# Patient Record
Sex: Female | Born: 1955
Health system: Southern US, Community
[De-identification: ages and names within clinical notes are randomized; demographics above are authoritative.]

## PROBLEM LIST (undated history)

## (undated) DIAGNOSIS — C801 Malignant (primary) neoplasm, unspecified: Secondary | ICD-10-CM

## (undated) DIAGNOSIS — K219 Gastro-esophageal reflux disease without esophagitis: Secondary | ICD-10-CM

## (undated) DIAGNOSIS — M1990 Unspecified osteoarthritis, unspecified site: Secondary | ICD-10-CM

## (undated) DIAGNOSIS — E669 Obesity, unspecified: Secondary | ICD-10-CM

## (undated) DIAGNOSIS — K5730 Diverticulosis of large intestine without perforation or abscess without bleeding: Secondary | ICD-10-CM

## (undated) DIAGNOSIS — E785 Hyperlipidemia, unspecified: Secondary | ICD-10-CM

## (undated) DIAGNOSIS — E119 Type 2 diabetes mellitus without complications: Secondary | ICD-10-CM

## (undated) DIAGNOSIS — D573 Sickle-cell trait: Secondary | ICD-10-CM

## (undated) DIAGNOSIS — I10 Essential (primary) hypertension: Secondary | ICD-10-CM

## (undated) HISTORY — PX: COLON SURGERY: SHX602

## (undated) HISTORY — PX: TONSILLECTOMY: SUR1361

## (undated) HISTORY — PX: ABDOMINAL HYSTERECTOMY: SHX81

## (undated) HISTORY — PX: COLONOSCOPY: SHX174

## (undated) HISTORY — PX: TRANSVERSE COLON RESECTION: SHX6155

## (undated) HISTORY — PX: LAPAROSCOPIC COLON RESECTION: SUR791

---

## 1999-06-09 ENCOUNTER — Encounter: Admission: RE | Admit: 1999-06-09 | Discharge: 1999-07-25 | Payer: Self-pay | Admitting: Family Medicine

## 2004-03-25 ENCOUNTER — Ambulatory Visit: Payer: Self-pay | Admitting: Internal Medicine

## 2004-06-27 ENCOUNTER — Ambulatory Visit: Payer: Self-pay | Admitting: Family Medicine

## 2005-06-28 ENCOUNTER — Ambulatory Visit: Payer: Self-pay | Admitting: Family Medicine

## 2006-07-01 ENCOUNTER — Ambulatory Visit: Payer: Self-pay | Admitting: Gastroenterology

## 2006-07-24 ENCOUNTER — Ambulatory Visit: Payer: Self-pay | Admitting: Family Medicine

## 2007-07-29 ENCOUNTER — Ambulatory Visit: Payer: Self-pay | Admitting: Family Medicine

## 2008-08-02 ENCOUNTER — Ambulatory Visit: Payer: Self-pay | Admitting: Family Medicine

## 2009-07-28 ENCOUNTER — Ambulatory Visit: Payer: Self-pay | Admitting: Unknown Physician Specialty

## 2010-09-26 ENCOUNTER — Ambulatory Visit: Payer: Self-pay | Admitting: Unknown Physician Specialty

## 2011-09-27 ENCOUNTER — Ambulatory Visit: Payer: Self-pay | Admitting: Physician Assistant

## 2011-10-12 ENCOUNTER — Ambulatory Visit: Payer: Self-pay | Admitting: Gastroenterology

## 2012-10-15 ENCOUNTER — Ambulatory Visit: Payer: Self-pay | Admitting: Physician Assistant

## 2013-11-06 ENCOUNTER — Ambulatory Visit: Payer: Self-pay | Admitting: Physician Assistant

## 2014-10-12 ENCOUNTER — Other Ambulatory Visit: Payer: Self-pay | Admitting: Physician Assistant

## 2014-10-12 DIAGNOSIS — Z1231 Encounter for screening mammogram for malignant neoplasm of breast: Secondary | ICD-10-CM

## 2014-11-10 ENCOUNTER — Ambulatory Visit
Admission: RE | Admit: 2014-11-10 | Discharge: 2014-11-10 | Disposition: A | Discharge status: Home / Self Care | Payer: 59 | Source: Ambulatory Visit | Attending: Physician Assistant | Admitting: Physician Assistant

## 2014-11-10 DIAGNOSIS — Z1231 Encounter for screening mammogram for malignant neoplasm of breast: Secondary | ICD-10-CM | POA: Insufficient documentation

## 2014-11-10 HISTORY — DX: Malignant (primary) neoplasm, unspecified: C80.1

## 2015-04-13 ENCOUNTER — Other Ambulatory Visit: Payer: Self-pay | Admitting: Physician Assistant

## 2015-04-13 DIAGNOSIS — R1011 Right upper quadrant pain: Secondary | ICD-10-CM

## 2015-04-22 ENCOUNTER — Ambulatory Visit
Admission: RE | Admit: 2015-04-22 | Discharge: 2015-04-22 | Disposition: A | Discharge status: Home / Self Care | Payer: 59 | Source: Ambulatory Visit | Attending: Physician Assistant | Admitting: Physician Assistant

## 2015-04-22 DIAGNOSIS — R1011 Right upper quadrant pain: Secondary | ICD-10-CM | POA: Insufficient documentation

## 2015-10-18 ENCOUNTER — Other Ambulatory Visit: Payer: Self-pay | Admitting: Physician Assistant

## 2015-10-18 DIAGNOSIS — Z1231 Encounter for screening mammogram for malignant neoplasm of breast: Secondary | ICD-10-CM

## 2015-11-21 ENCOUNTER — Ambulatory Visit
Admission: RE | Admit: 2015-11-21 | Discharge: 2015-11-21 | Disposition: A | Discharge status: Home / Self Care | Payer: 59 | Source: Ambulatory Visit | Attending: Physician Assistant | Admitting: Physician Assistant

## 2015-11-21 DIAGNOSIS — Z1231 Encounter for screening mammogram for malignant neoplasm of breast: Secondary | ICD-10-CM | POA: Diagnosis present

## 2016-04-13 DIAGNOSIS — I159 Secondary hypertension, unspecified: Secondary | ICD-10-CM | POA: Diagnosis not present

## 2016-04-13 DIAGNOSIS — R7303 Prediabetes: Secondary | ICD-10-CM | POA: Diagnosis not present

## 2016-04-13 DIAGNOSIS — E785 Hyperlipidemia, unspecified: Secondary | ICD-10-CM | POA: Diagnosis not present

## 2016-04-17 DIAGNOSIS — E782 Mixed hyperlipidemia: Secondary | ICD-10-CM | POA: Diagnosis not present

## 2016-04-17 DIAGNOSIS — I10 Essential (primary) hypertension: Secondary | ICD-10-CM | POA: Diagnosis not present

## 2016-04-17 DIAGNOSIS — E118 Type 2 diabetes mellitus with unspecified complications: Secondary | ICD-10-CM | POA: Diagnosis not present

## 2016-05-30 ENCOUNTER — Encounter: Payer: 59 | Attending: Physician Assistant | Admitting: Dietician

## 2016-05-30 ENCOUNTER — Encounter: Payer: Self-pay | Admitting: Dietician

## 2016-05-30 VITALS — Ht 63.0 in | Wt 253.5 lb

## 2016-05-30 DIAGNOSIS — Z713 Dietary counseling and surveillance: Secondary | ICD-10-CM | POA: Insufficient documentation

## 2016-05-30 DIAGNOSIS — Z6841 Body Mass Index (BMI) 40.0 and over, adult: Secondary | ICD-10-CM | POA: Insufficient documentation

## 2016-05-30 DIAGNOSIS — R7303 Prediabetes: Secondary | ICD-10-CM

## 2016-05-30 DIAGNOSIS — E119 Type 2 diabetes mellitus without complications: Secondary | ICD-10-CM | POA: Insufficient documentation

## 2016-05-30 NOTE — Progress Notes (Signed)
Medical Nutrition Therapy: Visit start time: 1630  end time: 1730  Assessment:  Diagnosis: Diabetes/ pre-diabetes; obesity Past medical history: HTN, GERD Psychosocial issues/ stress concerns: none Preferred learning method:  . Auditory . Visual . Hands-on  Current weight: 253.5lbs with shoes  Height: 5'3" Medications, supplements: reconciled list in medical record  Progress and evaluation: Patient reports making some diet changes recently to improve blood sugar, smaller food portions and avoiding lat-night snacks. She has not yet noticed any weight loss. She has tried weight watchers in the past which did not work well for her. Exercise has declined in recent months. She reports lactose intolerance, can tolerate small amounts every few days. Also reports GI symptoms when consuming soy products. She states that she and her daughter will both be working on healthy lifestyle changes. HbA1C was 6.6% on recent labwork, patient states it was tested more recently at her work, and was lower; she states she has pre-diabetes.    Physical activity: walking 30 minutes, 1 day per week  Dietary Intake:  Usual eating pattern includes 2-3 meals and 1-2 snacks per day. Dining out frequency: 4 meals per week.  Breakfast: 9-10am jimmy dean breakfast cup, 2 pieces toast; occasionally cereal, not often due t Snack: few chips or 4 oreos thins Lunch: sometimes fruit if not hungry; Nabs with peanut butter Snack: same as am Supper: meat and at least 2 vegetables, sometimes fast food due to schedule Snack: occasionally if hungry few chips or pita chips Beverages: water, 1 12oz regular soda daily --Coke or grape  Nutrition Care Education: Topics covered: diabetes (prevention), weight management Basic nutrition: basic food groups, appropriate nutrient balance, appropriate meal and snack schedule, general nutrition guidelines    Weight control: benefits of weight control, importance of low fat and low sugar  foods, portion control and appropriate portions of carbohydrate foods, benefit of regular exercise on weight control as well as blood sugar control; benefits of tracking intake and/ or goal progress.  Advanced nutrition: dining out-- fast food information Diabetes:  goals for HbA1C, appropriate meal and snack schedule, appropriate carb intake and balance with lean protein sources, importance of adequate vegetable intake for multiple health reasons, role of exercise.   Nutritional Diagnosis:  [place]-2.2 Altered nutrition-related laboratory As related to hyperglycemia.  As evidenced by lab report, patient report. [place]-3.3 Overweight/obesity As related to excess calories, inactivity.  As evidenced by BMI 44.8, patient report.  Intervention: Instruction as noted above.   Set goals with direction from patient.   She is unsure of weight loss progress prior to next appt, will be at [facility] with her daughter for several days.   She has been making some positive diet changes, and is motivated to continue improvements.  Education Materials given:  . General diet guidelines for Diabetes . Plate Planner . Sample meal pattern/ menus . Carb Counting and Meal Planning (Novo) . Goals/ instructions  Learner/ who was taught:  . Patient   Level of understanding: Marland Kitchen Verbalizes/ demonstrates competency  Demonstrated degree of understanding via:   Teach back Learning barriers: . None  Willingness to learn/ readiness for change: . Eager, change in progress  Monitoring and Evaluation:  Dietary intake, exercise, BG control, and body weight      follow up: 07/09/16

## 2016-05-30 NOTE — Patient Instructions (Signed)
   Include more vegetables on a daily basis; try having a serving at lunchtime or for a snack, and eat generous vegetable portions at dinner.  Have 1-2 fruit cups daily.   Control portions of the starchy foods, keep to 1 cup (fist-size) or less with each meal -- or 2-3 servings.   Resume some regular exercise by [facility] visits.

## 2016-07-09 ENCOUNTER — Encounter: Payer: Self-pay | Admitting: Dietician

## 2016-07-09 ENCOUNTER — Encounter: Payer: 59 | Attending: Physician Assistant | Admitting: Dietician

## 2016-07-09 VITALS — Ht 63.5 in | Wt 252.4 lb

## 2016-07-09 DIAGNOSIS — Z6841 Body Mass Index (BMI) 40.0 and over, adult: Secondary | ICD-10-CM | POA: Diagnosis not present

## 2016-07-09 DIAGNOSIS — Z713 Dietary counseling and surveillance: Secondary | ICD-10-CM | POA: Insufficient documentation

## 2016-07-09 DIAGNOSIS — E66813 Obesity, class 3: Secondary | ICD-10-CM

## 2016-07-09 DIAGNOSIS — E119 Type 2 diabetes mellitus without complications: Secondary | ICD-10-CM | POA: Diagnosis not present

## 2016-07-09 DIAGNOSIS — R7303 Prediabetes: Secondary | ICD-10-CM

## 2016-07-09 NOTE — Progress Notes (Signed)
Medical Nutrition Therapy: Visit start time: 0935 end time: 1010  Assessment:  Diagnosis: pre-diabetes, obesity Medical history changes: no changes per patient Psychosocial issues/ stress concerns: none  Current weight: 252.4lbs  Height: 5'3.5" Medications, supplement changes: no changes per patient  Progress and evaluation: Weight loss of 1.2lbs since previous visit on 05/30/16, patient feels due to increased general activity. She reports difficulty working on any diet or lifestyle changes over the past month due to multiple out of town sporting events with her daughter. She reports more frequent restaurant meals and no time for exercise. They are now finished with out of town events, so she is planning to begin diet changes. She reports stressful work situation, and feels she needs to deal with work stress by physical activity breaks and avoiding snacks.   Physical activity: none  Dietary Intake:  Usual eating pattern includes 2-3 meals and 0-1 snacks per day. Dining out frequency: 5+ meals per week.  Breakfast: Danton Clap breakfast bowl; eggs, toast, sausage/ bacon Snack: few chips Lunch: Nabs or fruit Snack: few chips with sandwich if hungry Supper: occasionally meat, starch vegetables; often restaurant meals due to daughter's travelling sports team Snack: none Beverages: water, 1 regular soda daily, OJ in morning, country time or minute maid lemonade (does not like aftertaste of sugar free versions)  Nutrition Care Education: Topics covered: weight management, diabetes prevention Basic nutrition: reviewed appropriate nutrient balance, appropriate meal and snack schedule    Weight control: options for easy and quick balanced meals, importance of low sugar foods including beverages.  Diabetes prevention: appropriate carb intake and balance Other lifestyle changes: options for increasing general activity as means of stress management.   Nutritional Diagnosis:  [place]-2.2 Altered  nutrition-related laboratory As related to hyperglycemia.  As evidenced by lab report, patient report. [place]-3.3 Overweight/obesity As related to excess calories, inactivity.  As evidenced by BMI 44, patient report.  Intervention: Discussion as noted above.   Patient has definite plans for making positive lifestyle changes.   She declined further RD follow-up at this time, but will schedule later if needed.  Education Materials given:  Marland Kitchen Sample meal pattern/ menus . Goals/ instructions  Learner/ who was taught:  . Patient   Level of understanding: Marland Kitchen Verbalizes/ demonstrates competency  Demonstrated degree of understanding via:   Teach back Learning barriers: . None  Willingness to learn/ readiness for change: . Acceptance, ready for change  Monitoring and Evaluation:  Dietary intake, exercise, BG control, and body weight      follow up: prn

## 2016-07-09 NOTE — Patient Instructions (Signed)
   Resume effort to increase vegetables and fruits daily. Keep mental track of your daily intake and give yourself "credit" such as a check on the calendar for days you eat 2-3 or more servings.   Deal with work stress by taking a time-out and moving away from your desk for a few minutes to divert thoughts and energy.   [facility]   Keep working to control portions of starchy foods such as potatoes, rice, pasta/ noodles-- good job limiting breads.

## 2016-10-11 DIAGNOSIS — E118 Type 2 diabetes mellitus with unspecified complications: Secondary | ICD-10-CM | POA: Diagnosis not present

## 2016-10-11 DIAGNOSIS — E782 Mixed hyperlipidemia: Secondary | ICD-10-CM | POA: Diagnosis not present

## 2016-10-11 DIAGNOSIS — I10 Essential (primary) hypertension: Secondary | ICD-10-CM | POA: Diagnosis not present

## 2016-10-18 ENCOUNTER — Other Ambulatory Visit: Payer: Self-pay | Admitting: Physician Assistant

## 2016-10-18 DIAGNOSIS — I10 Essential (primary) hypertension: Secondary | ICD-10-CM | POA: Diagnosis not present

## 2016-10-18 DIAGNOSIS — Z1231 Encounter for screening mammogram for malignant neoplasm of breast: Secondary | ICD-10-CM

## 2016-10-18 DIAGNOSIS — Z0000 Encounter for general adult medical examination without abnormal findings: Secondary | ICD-10-CM | POA: Diagnosis not present

## 2016-10-18 DIAGNOSIS — E782 Mixed hyperlipidemia: Secondary | ICD-10-CM | POA: Diagnosis not present

## 2016-11-21 ENCOUNTER — Ambulatory Visit
Admission: RE | Admit: 2016-11-21 | Discharge: 2016-11-21 | Disposition: A | Discharge status: Home / Self Care | Payer: 59 | Source: Ambulatory Visit | Attending: Physician Assistant | Admitting: Physician Assistant

## 2016-11-21 DIAGNOSIS — Z1231 Encounter for screening mammogram for malignant neoplasm of breast: Secondary | ICD-10-CM | POA: Insufficient documentation

## 2017-01-24 DIAGNOSIS — C189 Malignant neoplasm of colon, unspecified: Secondary | ICD-10-CM | POA: Diagnosis not present

## 2017-01-24 DIAGNOSIS — R1319 Other dysphagia: Secondary | ICD-10-CM | POA: Diagnosis not present

## 2017-01-24 DIAGNOSIS — K219 Gastro-esophageal reflux disease without esophagitis: Secondary | ICD-10-CM | POA: Diagnosis not present

## 2017-03-06 ENCOUNTER — Encounter
Admission: RE | Disposition: A | Discharge status: Home / Self Care | Payer: Self-pay | Source: Ambulatory Visit | Attending: Internal Medicine

## 2017-03-06 ENCOUNTER — Ambulatory Visit
Admission: RE | Admit: 2017-03-06 | Discharge: 2017-03-06 | Disposition: A | Discharge status: Home / Self Care | Payer: 59 | Source: Ambulatory Visit | Attending: Internal Medicine | Admitting: Internal Medicine

## 2017-03-06 ENCOUNTER — Ambulatory Visit: Payer: 59 | Admitting: Anesthesiology

## 2017-03-06 DIAGNOSIS — K5730 Diverticulosis of large intestine without perforation or abscess without bleeding: Secondary | ICD-10-CM | POA: Diagnosis not present

## 2017-03-06 DIAGNOSIS — K2980 Duodenitis without bleeding: Secondary | ICD-10-CM | POA: Diagnosis not present

## 2017-03-06 DIAGNOSIS — E669 Obesity, unspecified: Secondary | ICD-10-CM | POA: Diagnosis not present

## 2017-03-06 DIAGNOSIS — R1310 Dysphagia, unspecified: Secondary | ICD-10-CM | POA: Insufficient documentation

## 2017-03-06 DIAGNOSIS — K559 Vascular disorder of intestine, unspecified: Secondary | ICD-10-CM | POA: Diagnosis not present

## 2017-03-06 DIAGNOSIS — M1990 Unspecified osteoarthritis, unspecified site: Secondary | ICD-10-CM | POA: Insufficient documentation

## 2017-03-06 DIAGNOSIS — K219 Gastro-esophageal reflux disease without esophagitis: Secondary | ICD-10-CM | POA: Insufficient documentation

## 2017-03-06 DIAGNOSIS — Z6841 Body Mass Index (BMI) 40.0 and over, adult: Secondary | ICD-10-CM | POA: Diagnosis not present

## 2017-03-06 DIAGNOSIS — Z08 Encounter for follow-up examination after completed treatment for malignant neoplasm: Secondary | ICD-10-CM | POA: Diagnosis not present

## 2017-03-06 DIAGNOSIS — Z880 Allergy status to penicillin: Secondary | ICD-10-CM | POA: Diagnosis not present

## 2017-03-06 DIAGNOSIS — K640 First degree hemorrhoids: Secondary | ICD-10-CM | POA: Diagnosis not present

## 2017-03-06 DIAGNOSIS — I10 Essential (primary) hypertension: Secondary | ICD-10-CM | POA: Diagnosis not present

## 2017-03-06 DIAGNOSIS — E785 Hyperlipidemia, unspecified: Secondary | ICD-10-CM | POA: Diagnosis not present

## 2017-03-06 DIAGNOSIS — Z86010 Personal history of colonic polyps: Secondary | ICD-10-CM | POA: Diagnosis not present

## 2017-03-06 DIAGNOSIS — Z1211 Encounter for screening for malignant neoplasm of colon: Secondary | ICD-10-CM | POA: Diagnosis not present

## 2017-03-06 DIAGNOSIS — Z79899 Other long term (current) drug therapy: Secondary | ICD-10-CM | POA: Insufficient documentation

## 2017-03-06 DIAGNOSIS — Z85038 Personal history of other malignant neoplasm of large intestine: Secondary | ICD-10-CM | POA: Diagnosis not present

## 2017-03-06 DIAGNOSIS — D573 Sickle-cell trait: Secondary | ICD-10-CM | POA: Insufficient documentation

## 2017-03-06 DIAGNOSIS — K55031 Focal (segmental) acute (reversible) ischemia of large intestine: Secondary | ICD-10-CM | POA: Diagnosis not present

## 2017-03-06 DIAGNOSIS — K3189 Other diseases of stomach and duodenum: Secondary | ICD-10-CM | POA: Insufficient documentation

## 2017-03-06 DIAGNOSIS — K2970 Gastritis, unspecified, without bleeding: Secondary | ICD-10-CM | POA: Diagnosis not present

## 2017-03-06 DIAGNOSIS — K648 Other hemorrhoids: Secondary | ICD-10-CM | POA: Diagnosis not present

## 2017-03-06 DIAGNOSIS — K5790 Diverticulosis of intestine, part unspecified, without perforation or abscess without bleeding: Secondary | ICD-10-CM | POA: Diagnosis not present

## 2017-03-06 DIAGNOSIS — K2990 Gastroduodenitis, unspecified, without bleeding: Secondary | ICD-10-CM | POA: Diagnosis not present

## 2017-03-06 DIAGNOSIS — K2950 Unspecified chronic gastritis without bleeding: Secondary | ICD-10-CM | POA: Insufficient documentation

## 2017-03-06 HISTORY — PX: ESOPHAGOGASTRODUODENOSCOPY (EGD) WITH PROPOFOL: SHX5813

## 2017-03-06 HISTORY — DX: Diverticulosis of large intestine without perforation or abscess without bleeding: K57.30

## 2017-03-06 HISTORY — DX: Sickle-cell trait: D57.3

## 2017-03-06 HISTORY — DX: Gastro-esophageal reflux disease without esophagitis: K21.9

## 2017-03-06 HISTORY — DX: Hyperlipidemia, unspecified: E78.5

## 2017-03-06 HISTORY — DX: Essential (primary) hypertension: I10

## 2017-03-06 HISTORY — DX: Obesity, unspecified: E66.9

## 2017-03-06 HISTORY — PX: COLONOSCOPY WITH PROPOFOL: SHX5780

## 2017-03-06 HISTORY — DX: Unspecified osteoarthritis, unspecified site: M19.90

## 2017-03-06 SURGERY — ESOPHAGOGASTRODUODENOSCOPY (EGD) WITH PROPOFOL
Anesthesia: General

## 2017-03-06 MED ORDER — LIDOCAINE HCL (CARDIAC) 20 MG/ML IV SOLN
INTRAVENOUS | Status: DC | PRN
  Administered 2017-03-06: 80 mg via INTRAVENOUS

## 2017-03-06 MED ORDER — PROPOFOL 10 MG/ML IV BOLUS
INTRAVENOUS | Status: DC | PRN
  Administered 2017-03-06: 80 mg via INTRAVENOUS

## 2017-03-06 MED ORDER — PROPOFOL 10 MG/ML IV BOLUS
INTRAVENOUS | Status: AC
  Filled 2017-03-06: qty 40

## 2017-03-06 MED ORDER — FENTANYL CITRATE (PF) 100 MCG/2ML IJ SOLN
INTRAMUSCULAR | Status: DC | PRN
  Administered 2017-03-06: 50 ug via INTRAVENOUS

## 2017-03-06 MED ORDER — FENTANYL CITRATE (PF) 100 MCG/2ML IJ SOLN
INTRAMUSCULAR | Status: AC
  Filled 2017-03-06: qty 2

## 2017-03-06 MED ORDER — SODIUM CHLORIDE 0.9 % IV SOLN
INTRAVENOUS | Status: DC
  Administered 2017-03-06: 10:00:00 via INTRAVENOUS

## 2017-03-06 MED ORDER — PROPOFOL 500 MG/50ML IV EMUL
INTRAVENOUS | Status: DC | PRN
  Administered 2017-03-06: 150 ug/kg/min via INTRAVENOUS

## 2017-03-06 NOTE — Anesthesia Postprocedure Evaluation (Signed)
Anesthesia Post Note  Patient: MARLEN KOMAN  Procedure(s) Performed: ESOPHAGOGASTRODUODENOSCOPY (EGD) WITH PROPOFOL (N/A ) COLONOSCOPY WITH PROPOFOL (N/A )  Patient location during evaluation: Endoscopy Anesthesia Type: General Level of consciousness: awake and alert and oriented Pain management: pain level controlled Vital Signs Assessment: post-procedure vital signs reviewed and stable Respiratory status: spontaneous breathing, nonlabored ventilation and respiratory function stable Cardiovascular status: blood pressure returned to baseline and stable Postop Assessment: no signs of nausea or vomiting Anesthetic complications: no     Last Vitals:  Vitals:   03/06/17 0956 03/06/17 1113  BP: 115/78 102/74  Pulse: 66   Resp: 20   Temp: (!) 36.1 C   SpO2: 97%     Last Pain:  Vitals:   03/06/17 0956  TempSrc: Tympanic                 Andersen Mckiver

## 2017-03-06 NOTE — Op Note (Signed)
[facility] Gastroenterology Patient Name: Terri Cain Procedure Date: 03/06/2017 10:39 AM MRN: [id number] Account #: [id number] Date of Birth: 1955/06/19 Admit Type: Outpatient Age: 62 Room: [facility] ENDO ROOM 4 Gender: Female Note Status: Finalized Procedure:            Colonoscopy Indications:          High risk colon cancer surveillance: Personal history                        of colon cancer Providers:            Benay Pike. Alice Reichert MD, MD Referring MD:         [facility]Barton Dubois (Referring MD), Precious Bard, MD (Referring MD) Medicines:            Propofol per Anesthesia Complications:        No immediate complications. Procedure:            Pre-Anesthesia Assessment:                       - The risks and benefits of the procedure and the                        sedation options and risks were discussed with the                        patient. All questions were answered and informed                        consent was obtained.                       - Patient identification and proposed procedure were                        verified prior to the procedure by the nurse. The                        procedure was verified in the procedure room.                       - ASA Grade Assessment: II - A patient with mild                        systemic disease.                       - After reviewing the risks and benefits, the patient                        was deemed in satisfactory condition to undergo the                        procedure.                       After obtaining informed consent, the colonoscope was  passed under direct vision. Throughout the procedure,                        the patient's blood pressure, pulse, and oxygen                        saturations were monitored continuously. The                        Colonoscope was introduced through the anus and   advanced to the the cecum, identified by appendiceal                        orifice and ileocecal valve. The colonoscopy was                        performed without difficulty. The patient tolerated the                        procedure well. The quality of the bowel preparation                        was good. Findings:      The perianal and digital rectal examinations were normal. Pertinent       negatives include normal sphincter tone and no palpable rectal lesions.      A few small-mouthed diverticula were found in the sigmoid colon and       transverse colon. There was no evidence of diverticular bleeding.      Non-bleeding internal hemorrhoids were found during retroflexion. The       hemorrhoids were Grade I (internal hemorrhoids that do not prolapse).      A single (solitary) one mm ulcer was found in the transverse colon. No       bleeding was present. No stigmata of recent bleeding were seen. Biopsies       were taken with a cold forceps for histology.      The exam was otherwise without abnormality. Impression:           - Mild diverticulosis in the sigmoid colon and in the                        transverse colon. There was no evidence of diverticular                        bleeding.                       - Non-bleeding internal hemorrhoids.                       - A single (solitary) ulcer in the transverse colon.                        Biopsied.                       - The examination was otherwise normal. Recommendation:       - Patient has a contact number available for                        emergencies. The signs and symptoms  of potential                        delayed complications were discussed with the patient.                        Return to normal activities tomorrow. Written discharge                        instructions were provided to the patient.                       - Resume previous diet.                       - Continue present medications.                        - Await pathology results.                       - Repeat colonoscopy in 5 years for surveillance.                       - Return to GI office in 6 months. Procedure Code(s):    --- Professional ---                       [phone], Colonoscopy, flexible; with biopsy, single or                        multiple Diagnosis Code(s):    --- Professional ---                       [phone], Personal history of other malignant neoplasm                        of large intestine                       K64.0, First degree hemorrhoids                       K63.3, Ulcer of intestine                       K57.30, Diverticulosis of large intestine without                        perforation or abscess without bleeding CPT copyright 2016 American Medical Association. All rights reserved. The codes documented in this report are preliminary and upon coder review may  be revised to meet current compliance requirements. Efrain Sella MD, MD 03/06/2017 11:11:33 AM This report has been signed electronically. Number of Addenda: 0 Note Initiated On: 03/06/2017 10:39 AM Scope Withdrawal Time: 0 hours 7 minutes 6 seconds  Total Procedure Duration: 0 hours 12 minutes 33 seconds       [facility]

## 2017-03-06 NOTE — Anesthesia Preprocedure Evaluation (Signed)
Anesthesia Evaluation  Patient identified by MRN, date of birth, ID band Patient awake    Reviewed: Allergy & Precautions, NPO status , Patient's Chart, lab work & pertinent test results  History of Anesthesia Complications Negative for: history of anesthetic complications  Airway Mallampati: III  TM Distance: >3 FB Neck ROM: Full    Dental no notable dental hx.    Pulmonary neg pulmonary ROS, neg sleep apnea, neg COPD,    breath sounds clear to auscultation- rhonchi (-) wheezing      Cardiovascular hypertension, Pt. on medications (-) CAD, (-) Past MI, (-) Cardiac Stents and (-) CABG  Rhythm:Regular Rate:Normal - Systolic murmurs and - Diastolic murmurs    Neuro/Psych negative neurological ROS  negative psych ROS   GI/Hepatic Neg liver ROS, GERD  ,  Endo/Other  negative endocrine ROSneg diabetes  Renal/GU negative Renal ROS     Musculoskeletal  (+) Arthritis ,   Abdominal (+) + obese,   Peds  Hematology negative hematology ROS (+)   Anesthesia Other Findings Past Medical History: No date: Arthritis No date: Cancer ([place])     Comment:  colon cancer No date: Diverticula of colon No date: GERD (gastroesophageal reflux disease) No date: Hyperlipidemia No date: Hypertension No date: Obesity No date: Sickle cell trait (HCC)   Reproductive/Obstetrics                             Anesthesia Physical Anesthesia Plan  ASA: II  Anesthesia Plan: General   Post-op Pain Management:    Induction: Intravenous  PONV Risk Score and Plan: 2 and Propofol infusion  Airway Management Planned: Natural Airway  Additional Equipment:   Intra-op Plan:   Post-operative Plan:   Informed Consent: I have reviewed the patients History and Physical, chart, labs and discussed the procedure including the risks, benefits and alternatives for the proposed anesthesia with the patient or authorized  representative who has indicated his/her understanding and acceptance.   Dental advisory given  Plan Discussed with: CRNA and Anesthesiologist  Anesthesia Plan Comments:         Anesthesia Quick Evaluation

## 2017-03-06 NOTE — Op Note (Signed)
[facility] Gastroenterology Patient Name: Terri Cain Procedure Date: 03/06/2017 10:42 AM MRN: [id number] Account #: [id number] Date of Birth: 1955-07-28 Admit Type: Outpatient Age: 62 Room: [facility] ENDO ROOM 4 Gender: Female Note Status: Finalized Procedure:            Upper GI endoscopy Indications:          Dysphagia, Esophageal reflux Providers:            Benay Pike. Alice Reichert MD, MD Referring MD:         Precious Bard, MD (Referring MD) Medicines:            Propofol per Anesthesia Complications:        No immediate complications. Procedure:            Pre-Anesthesia Assessment:                       - The risks and benefits of the procedure and the                        sedation options and risks were discussed with the                        patient. All questions were answered and informed                        consent was obtained.                       - Patient identification and proposed procedure were                        verified prior to the procedure by the nurse. The                        procedure was verified in the procedure room.                       - ASA Grade Assessment: II - A patient with mild                        systemic disease.                       - After reviewing the risks and benefits, the patient                        was deemed in satisfactory condition to undergo the                        procedure.                       After obtaining informed consent, the endoscope was                        passed under direct vision. Throughout the procedure,                        the patient's blood pressure, pulse, and oxygen  saturations were monitored continuously. The Endoscope                        was introduced through the mouth, and advanced to the                        third part of duodenum. The upper GI endoscopy was                        accomplished without difficulty. The patient  tolerated                        the procedure well. Findings:      No endoscopic abnormality was evident in the esophagus to explain the       patient's complaint of dysphagia. The scope was withdrawn. Dilation was       performed with a Maloney dilator with no resistance at 9 Fr.      Localized mild inflammation characterized by erosions was found in the       gastric antrum.      Localized moderately erythematous mucosa without active bleeding and       with no stigmata of bleeding was found in the duodenal bulb. Impression:           - No endoscopic esophageal abnormality to explain                        patient's dysphagia. Dilated.                       - Chronic gastritis.                       - Erythematous duodenopathy.                       - No specimens collected. Recommendation:       - Await pathology results.                       - Proceed with colonoscopy Procedure Code(s):    --- Professional ---                       [phone], Esophagogastroduodenoscopy, flexible, transoral;                        diagnostic, including collection of specimen(s) by                        brushing or washing, when performed (separate procedure)                       43450, Dilation of esophagus, by unguided sound or                        bougie, single or multiple passes Diagnosis Code(s):    --- Professional ---                       K21.9, Gastro-esophageal reflux disease without                        esophagitis  K31.89, Other diseases of stomach and duodenum                       K29.50, Unspecified chronic gastritis without bleeding                       R13.10, Dysphagia, unspecified CPT copyright 2016 American Medical Association. All rights reserved. The codes documented in this report are preliminary and upon coder review may  be revised to meet current compliance requirements. Efrain Sella MD, MD 03/06/2017 10:50:49 AM This report has been signed  electronically. Number of Addenda: 0 Note Initiated On: 03/06/2017 10:42 AM      [facility]

## 2017-03-06 NOTE — Anesthesia Post-op Follow-up Note (Signed)
Anesthesia QCDR form completed.        

## 2017-03-06 NOTE — Transfer of Care (Signed)
Immediate Anesthesia Transfer of Care Note  Patient: Terri Cain  Procedure(s) Performed: ESOPHAGOGASTRODUODENOSCOPY (EGD) WITH PROPOFOL (N/A ) COLONOSCOPY WITH PROPOFOL (N/A )  Patient Location: PACU  Anesthesia Type:General  Level of Consciousness: awake and alert   Airway & Oxygen Therapy: Patient Spontanous Breathing  Post-op Assessment: Report given to RN  Post vital signs: Reviewed and stable  Last Vitals:  Vitals:   03/06/17 0956  BP: 115/78  Pulse: 66  Resp: 20  Temp: (!) 36.1 C  SpO2: 97%    Last Pain:  Vitals:   03/06/17 0956  TempSrc: Tympanic         Complications: No apparent anesthesia complications

## 2017-03-06 NOTE — H&P (Signed)
Outpatient short stay form Pre-procedure 03/06/2017 10:06 AM Terri Elza K. Terri Cain, M.D.  Primary Physician: Paulita Cradle, PA  Reason for visit:  Intermittent esophageal dysphagia, personal hx of cancerous colon polyp.  History of present illness:  Patient is a pleasant 62 year old African-American female presenting for colon polyp surveillance due to history of remote colon cancer in situ in a polyp. Last colonoscopy 2013 revealed no evidence of polyps and showed only colonic diverticulosis. Patient has intermittent solid food dysphagia as well as a history of GERD which is controlled on medication.    Current Facility-Administered Medications:  .  0.9 %  sodium chloride infusion, , Intravenous, Continuous, [place], Benay Pike, MD, Last Rate: 20 mL/hr at 03/06/17 4970  Medications Prior to Admission  Medication Sig Dispense Refill Last Dose  . lisinopril-hydrochlorothiazide (PRINZIDE,ZESTORETIC) 20-25 MG tablet Take by mouth.   03/06/2017 at Unknown time  . metoprolol succinate (TOPROL-XL) 25 MG 24 hr tablet Take by mouth.   03/06/2017 at Unknown time  . ranitidine (ZANTAC) 150 MG tablet Take by mouth.   03/05/2017 at Unknown time  . Aspirin-Caffeine (ANACIN PO) Take 2 tablets by mouth as needed (for pain).   Taking  . esomeprazole (NEXIUM) 40 MG capsule Take by mouth.   Not Taking at Unknown time  . meloxicam (MOBIC) 7.5 MG tablet Take 7.5 mg by mouth daily.   Not Taking at Unknown time  . Multiple Vitamin (MULTI-VITAMINS) TABS Take by mouth.   Not Taking at Unknown time     Allergies  Allergen Reactions  . Penicillins Hives     Past Medical History:  Diagnosis Date  . Arthritis   . Cancer [facility])    colon cancer  . Diverticula of colon   . GERD (gastroesophageal reflux disease)   . Hyperlipidemia   . Hypertension   . Obesity   . Sickle cell trait ([place])     Review of systems:      Physical Exam  Gen: Alert, oriented. Appears stated age.  HEENT: [place]/AT. PERRLA. Lungs:  CTA, no wheezes. CV: RR nl S1, S2. Abd: soft, benign, no masses. BS+ Ext: No edema. Pulses 2+    Planned procedures: Proceed with EGD and colonoscopy.The patient understands the nature of the planned procedure, indications, risks, alternatives and potential complications including but not limited to bleeding, infection, perforation, damage to internal organs and possible oversedation/side effects from anesthesia. The patient agrees and gives consent to proceed.  Please refer to procedure notes for findings, recommendations and patient disposition/instructions.    [name] K. Terri Cain, M.D. Gastroenterology 03/06/2017  10:06 AM

## 2017-03-07 ENCOUNTER — Encounter: Payer: Self-pay | Admitting: Internal Medicine

## 2017-03-07 LAB — SURGICAL PATHOLOGY

## 2017-04-11 DIAGNOSIS — E782 Mixed hyperlipidemia: Secondary | ICD-10-CM | POA: Diagnosis not present

## 2017-04-11 DIAGNOSIS — I10 Essential (primary) hypertension: Secondary | ICD-10-CM | POA: Diagnosis not present

## 2017-04-11 DIAGNOSIS — E118 Type 2 diabetes mellitus with unspecified complications: Secondary | ICD-10-CM | POA: Diagnosis not present

## 2017-04-18 DIAGNOSIS — M5441 Lumbago with sciatica, right side: Secondary | ICD-10-CM | POA: Diagnosis not present

## 2017-04-18 DIAGNOSIS — G8929 Other chronic pain: Secondary | ICD-10-CM | POA: Diagnosis not present

## 2017-04-18 DIAGNOSIS — I10 Essential (primary) hypertension: Secondary | ICD-10-CM | POA: Diagnosis not present

## 2017-04-18 DIAGNOSIS — E782 Mixed hyperlipidemia: Secondary | ICD-10-CM | POA: Diagnosis not present

## 2017-04-18 DIAGNOSIS — E118 Type 2 diabetes mellitus with unspecified complications: Secondary | ICD-10-CM | POA: Diagnosis not present

## 2017-04-18 DIAGNOSIS — M545 Low back pain: Secondary | ICD-10-CM | POA: Diagnosis not present

## 2017-10-15 DIAGNOSIS — E782 Mixed hyperlipidemia: Secondary | ICD-10-CM | POA: Diagnosis not present

## 2017-10-15 DIAGNOSIS — I10 Essential (primary) hypertension: Secondary | ICD-10-CM | POA: Diagnosis not present

## 2017-10-15 DIAGNOSIS — E118 Type 2 diabetes mellitus with unspecified complications: Secondary | ICD-10-CM | POA: Diagnosis not present

## 2017-10-23 ENCOUNTER — Other Ambulatory Visit: Payer: Self-pay | Admitting: Physician Assistant

## 2017-10-23 DIAGNOSIS — E782 Mixed hyperlipidemia: Secondary | ICD-10-CM | POA: Diagnosis not present

## 2017-10-23 DIAGNOSIS — Z0000 Encounter for general adult medical examination without abnormal findings: Secondary | ICD-10-CM | POA: Diagnosis not present

## 2017-10-23 DIAGNOSIS — I10 Essential (primary) hypertension: Secondary | ICD-10-CM | POA: Diagnosis not present

## 2017-10-23 DIAGNOSIS — Z1231 Encounter for screening mammogram for malignant neoplasm of breast: Secondary | ICD-10-CM

## 2017-11-22 ENCOUNTER — Ambulatory Visit
Admission: RE | Admit: 2017-11-22 | Discharge: 2017-11-22 | Disposition: A | Discharge status: Home / Self Care | Payer: 59 | Source: Ambulatory Visit | Attending: Physician Assistant | Admitting: Physician Assistant

## 2017-11-22 DIAGNOSIS — Z1231 Encounter for screening mammogram for malignant neoplasm of breast: Secondary | ICD-10-CM | POA: Diagnosis not present

## 2018-03-03 DIAGNOSIS — E1165 Type 2 diabetes mellitus with hyperglycemia: Secondary | ICD-10-CM | POA: Diagnosis not present

## 2018-04-16 DIAGNOSIS — I10 Essential (primary) hypertension: Secondary | ICD-10-CM | POA: Diagnosis not present

## 2018-04-16 DIAGNOSIS — E118 Type 2 diabetes mellitus with unspecified complications: Secondary | ICD-10-CM | POA: Diagnosis not present

## 2018-04-16 DIAGNOSIS — E782 Mixed hyperlipidemia: Secondary | ICD-10-CM | POA: Diagnosis not present

## 2018-04-24 DIAGNOSIS — I10 Essential (primary) hypertension: Secondary | ICD-10-CM | POA: Diagnosis not present

## 2018-04-24 DIAGNOSIS — E1165 Type 2 diabetes mellitus with hyperglycemia: Secondary | ICD-10-CM | POA: Diagnosis not present

## 2018-04-24 DIAGNOSIS — E782 Mixed hyperlipidemia: Secondary | ICD-10-CM | POA: Diagnosis not present

## 2018-10-30 ENCOUNTER — Other Ambulatory Visit: Payer: Self-pay | Admitting: Physician Assistant

## 2018-10-30 DIAGNOSIS — Z1231 Encounter for screening mammogram for malignant neoplasm of breast: Secondary | ICD-10-CM

## 2018-12-08 ENCOUNTER — Ambulatory Visit
Admission: RE | Admit: 2018-12-08 | Discharge: 2018-12-08 | Disposition: A | Discharge status: Home / Self Care | Payer: 59 | Source: Ambulatory Visit | Attending: Physician Assistant | Admitting: Physician Assistant

## 2018-12-08 DIAGNOSIS — Z1231 Encounter for screening mammogram for malignant neoplasm of breast: Secondary | ICD-10-CM | POA: Diagnosis not present

## 2019-03-20 ENCOUNTER — Ambulatory Visit: Payer: 59 | Attending: Internal Medicine

## 2019-03-20 DIAGNOSIS — Z23 Encounter for immunization: Secondary | ICD-10-CM

## 2019-03-20 NOTE — Progress Notes (Signed)
   Covid-19 Vaccination Clinic  Name:  Terri Cain    MRN: [id number] DOB: September 20, 1955  03/20/2019  [name] was observed post Covid-19 immunization for 15 minutes without incident. She was provided with Vaccine Information Sheet and instruction to access the V-Safe system.   [name] was instructed to call 911 with any severe reactions post vaccine: Marland Kitchen Difficulty breathing  . Swelling of face and throat  . A fast heartbeat  . A bad rash all over body  . Dizziness and weakness   Immunizations Administered    Name Date Dose VIS Date Route   Moderna COVID-19 Vaccine 03/20/2019  3:31 PM 0.5 mL 12/09/2018 Intramuscular   Manufacturer: Moderna   Lot: [id number]   [id number]

## 2019-04-21 ENCOUNTER — Ambulatory Visit: Payer: 59 | Attending: Internal Medicine

## 2019-04-21 DIAGNOSIS — Z23 Encounter for immunization: Secondary | ICD-10-CM

## 2019-04-21 NOTE — Progress Notes (Signed)
   Covid-19 Vaccination Clinic  Name:  Terri Cain    MRN: [id number] DOB: [birth year]  04/21/2019  Terri Cain was observed post Covid-19 immunization for 15 minutes without incident. She was provided with Vaccine Information Sheet and instruction to access the V-Safe system.   Terri Cain was instructed to call 911 with any severe reactions post vaccine: Marland Kitchen Difficulty breathing  . Swelling of face and throat  . A fast heartbeat  . A bad rash all over body  . Dizziness and weakness   Immunizations Administered    Name Date Dose VIS Date Route   Moderna COVID-19 Vaccine 04/21/2019 10:34 AM 0.5 mL 12/09/2018 Intramuscular   Manufacturer: Moderna   Lot: [id number]   [id number]

## 2019-11-09 ENCOUNTER — Other Ambulatory Visit: Payer: Self-pay | Admitting: Physician Assistant

## 2019-11-09 DIAGNOSIS — Z1231 Encounter for screening mammogram for malignant neoplasm of breast: Secondary | ICD-10-CM

## 2019-12-09 ENCOUNTER — Ambulatory Visit
Admission: RE | Admit: 2019-12-09 | Discharge: 2019-12-09 | Disposition: A | Discharge status: Home / Self Care | Payer: 59 | Source: Ambulatory Visit | Attending: Physician Assistant | Admitting: Physician Assistant

## 2019-12-09 ENCOUNTER — Other Ambulatory Visit: Payer: Self-pay

## 2019-12-09 DIAGNOSIS — Z1231 Encounter for screening mammogram for malignant neoplasm of breast: Secondary | ICD-10-CM

## 2020-05-16 ENCOUNTER — Other Ambulatory Visit: Payer: Self-pay | Admitting: Physician Assistant

## 2020-05-16 DIAGNOSIS — R1011 Right upper quadrant pain: Secondary | ICD-10-CM

## 2020-05-16 DIAGNOSIS — I10 Essential (primary) hypertension: Secondary | ICD-10-CM

## 2020-05-18 ENCOUNTER — Ambulatory Visit (HOSPITAL_COMMUNITY)
Admission: RE | Admit: 2020-05-18 | Discharge: 2020-05-18 | Disposition: A | Discharge status: Home / Self Care | Payer: Medicare Other | Source: Ambulatory Visit | Attending: Physician Assistant | Admitting: Physician Assistant

## 2020-05-18 ENCOUNTER — Other Ambulatory Visit: Payer: Self-pay

## 2020-05-18 DIAGNOSIS — N2889 Other specified disorders of kidney and ureter: Secondary | ICD-10-CM | POA: Insufficient documentation

## 2020-05-18 DIAGNOSIS — I10 Essential (primary) hypertension: Secondary | ICD-10-CM

## 2020-05-18 DIAGNOSIS — R1011 Right upper quadrant pain: Secondary | ICD-10-CM | POA: Diagnosis not present

## 2020-05-18 DIAGNOSIS — K7689 Other specified diseases of liver: Secondary | ICD-10-CM | POA: Diagnosis not present

## 2020-05-19 ENCOUNTER — Other Ambulatory Visit: Payer: Self-pay | Admitting: Physician Assistant

## 2020-05-19 DIAGNOSIS — N2889 Other specified disorders of kidney and ureter: Secondary | ICD-10-CM

## 2020-05-27 ENCOUNTER — Ambulatory Visit
Admission: RE | Admit: 2020-05-27 | Discharge: 2020-05-27 | Disposition: A | Discharge status: Home / Self Care | Payer: Medicare Other | Source: Ambulatory Visit | Attending: Physician Assistant | Admitting: Physician Assistant

## 2020-05-27 ENCOUNTER — Other Ambulatory Visit: Payer: Self-pay

## 2020-05-27 DIAGNOSIS — N2889 Other specified disorders of kidney and ureter: Secondary | ICD-10-CM

## 2020-05-27 MED ORDER — GADOBUTROL 1 MMOL/ML IV SOLN: 10.0000 mL | Freq: Once | INTRAVENOUS | Status: DC | PRN

## 2020-06-01 ENCOUNTER — Other Ambulatory Visit: Payer: Self-pay | Admitting: Physician Assistant

## 2020-06-01 DIAGNOSIS — N2889 Other specified disorders of kidney and ureter: Secondary | ICD-10-CM

## 2020-06-20 ENCOUNTER — Other Ambulatory Visit: Payer: Medicare Other

## 2020-06-21 ENCOUNTER — Other Ambulatory Visit: Payer: Self-pay

## 2020-06-21 ENCOUNTER — Ambulatory Visit
Admission: RE | Admit: 2020-06-21 | Discharge: 2020-06-21 | Disposition: A | Discharge status: Home / Self Care | Payer: Medicare Other | Source: Ambulatory Visit | Attending: Physician Assistant | Admitting: Physician Assistant

## 2020-06-21 DIAGNOSIS — N2889 Other specified disorders of kidney and ureter: Secondary | ICD-10-CM

## 2020-07-04 ENCOUNTER — Other Ambulatory Visit: Payer: Self-pay | Admitting: Physician Assistant

## 2020-07-07 ENCOUNTER — Other Ambulatory Visit: Payer: Self-pay | Admitting: Physician Assistant

## 2020-07-07 DIAGNOSIS — N2889 Other specified disorders of kidney and ureter: Secondary | ICD-10-CM

## 2020-08-04 ENCOUNTER — Ambulatory Visit
Admission: RE | Admit: 2020-08-04 | Discharge: 2020-08-04 | Disposition: A | Discharge status: Home / Self Care | Payer: Medicare Other | Source: Ambulatory Visit | Attending: Physician Assistant | Admitting: Physician Assistant

## 2020-08-04 ENCOUNTER — Other Ambulatory Visit: Payer: Self-pay

## 2020-08-04 DIAGNOSIS — N2889 Other specified disorders of kidney and ureter: Secondary | ICD-10-CM | POA: Diagnosis present

## 2020-08-04 LAB — POCT I-STAT CREATININE: Creatinine, Ser: 0.8 mg/dL (ref 0.44–1.00)

## 2020-08-04 MED ORDER — IOHEXOL 350 MG/ML SOLN
100.0000 mL | Freq: Once | INTRAVENOUS | Status: AC | PRN
  Administered 2020-08-04: 100 mL via INTRAVENOUS

## 2020-11-24 ENCOUNTER — Other Ambulatory Visit: Payer: Self-pay | Admitting: Physician Assistant

## 2020-11-24 DIAGNOSIS — Z1231 Encounter for screening mammogram for malignant neoplasm of breast: Secondary | ICD-10-CM

## 2020-12-09 ENCOUNTER — Ambulatory Visit
Admission: RE | Admit: 2020-12-09 | Discharge: 2020-12-09 | Disposition: A | Discharge status: Home / Self Care | Payer: Medicare Other | Source: Ambulatory Visit | Attending: Physician Assistant | Admitting: Physician Assistant

## 2020-12-09 ENCOUNTER — Other Ambulatory Visit: Payer: Self-pay

## 2020-12-09 DIAGNOSIS — Z1231 Encounter for screening mammogram for malignant neoplasm of breast: Secondary | ICD-10-CM | POA: Diagnosis present

## 2021-11-21 IMAGING — MG DIGITAL SCREENING BILAT W/ TOMO W/ CAD
8 series · 8 of 24 positions shown · non-contrast
Comparison: Previous exam(s).

CLINICAL DATA: Screening.

EXAM:
DIGITAL SCREENING BILATERAL MAMMOGRAM WITH TOMO AND CAD

[L MLO synth-2D]
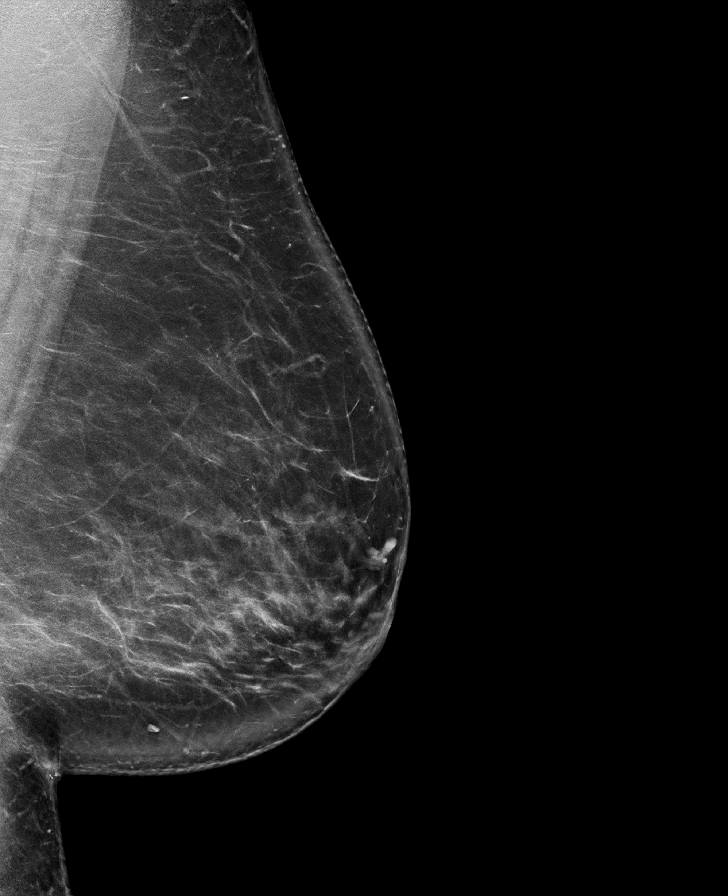

[R MLO synth-2D]
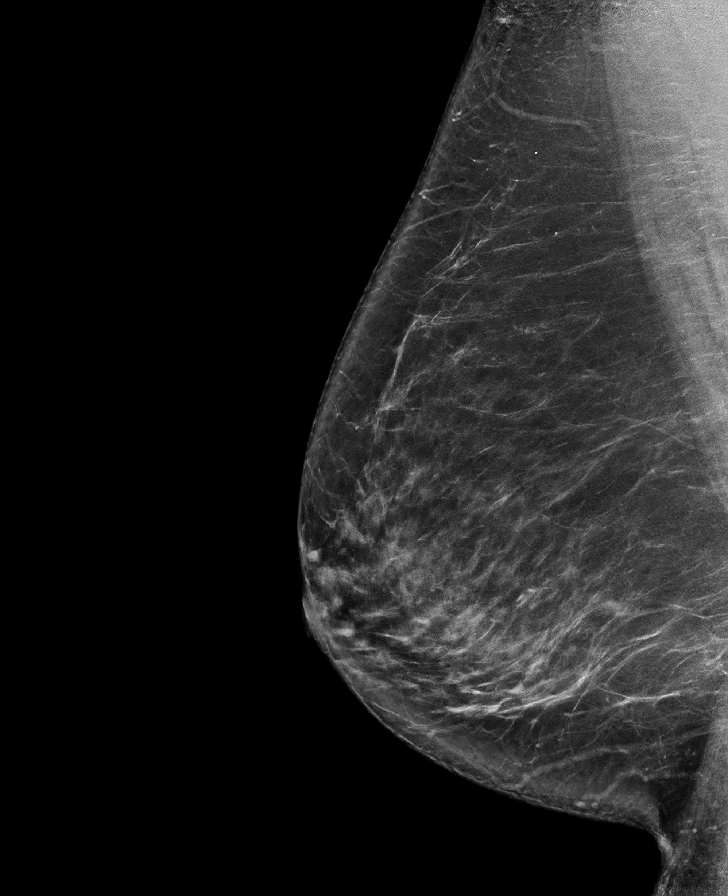

[L CC synth-2D]
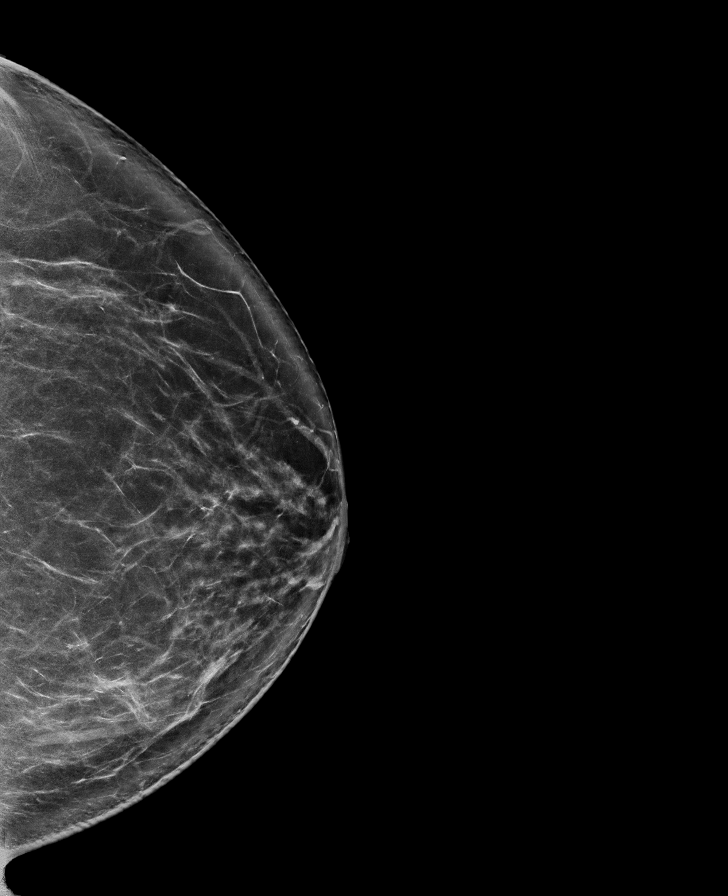

[R CC synth-2D]
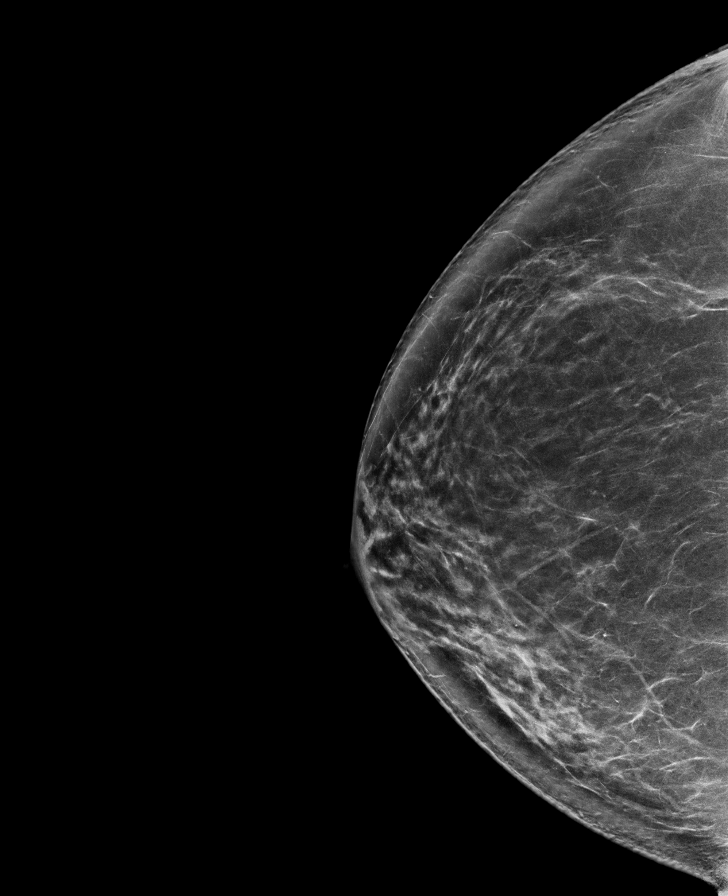

[R CC tomo · tomo slice 47/94.0]
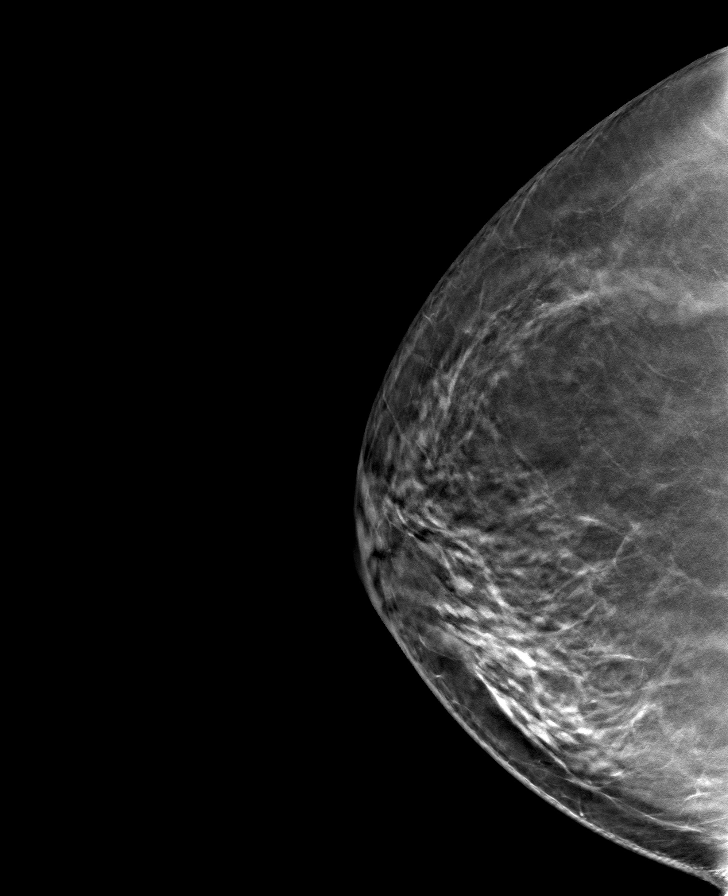

[R MLO tomo · tomo slice 51/101.0]
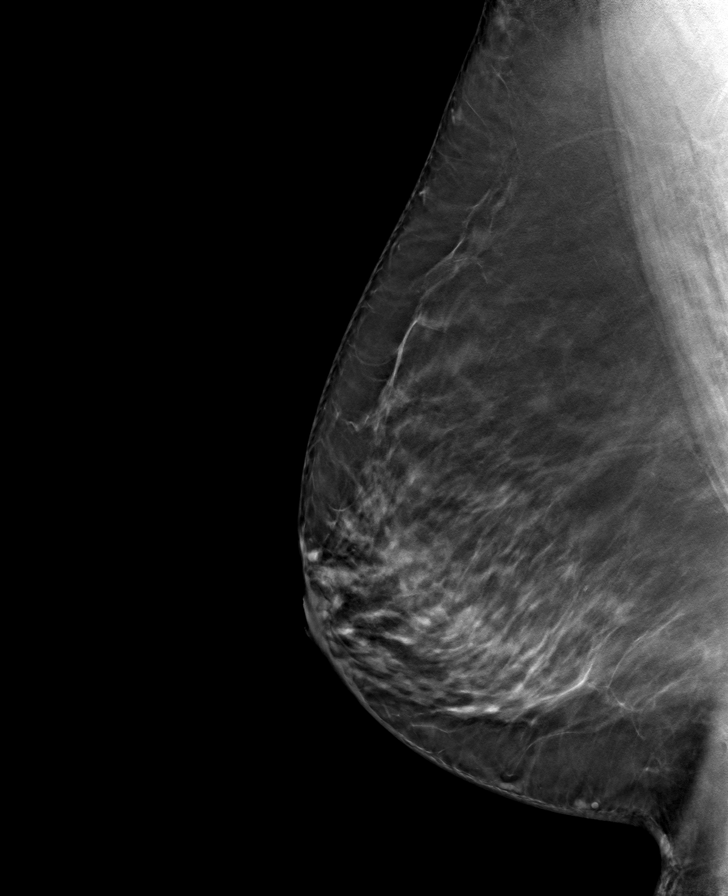

[L CC tomo · tomo slice 48/95.0]
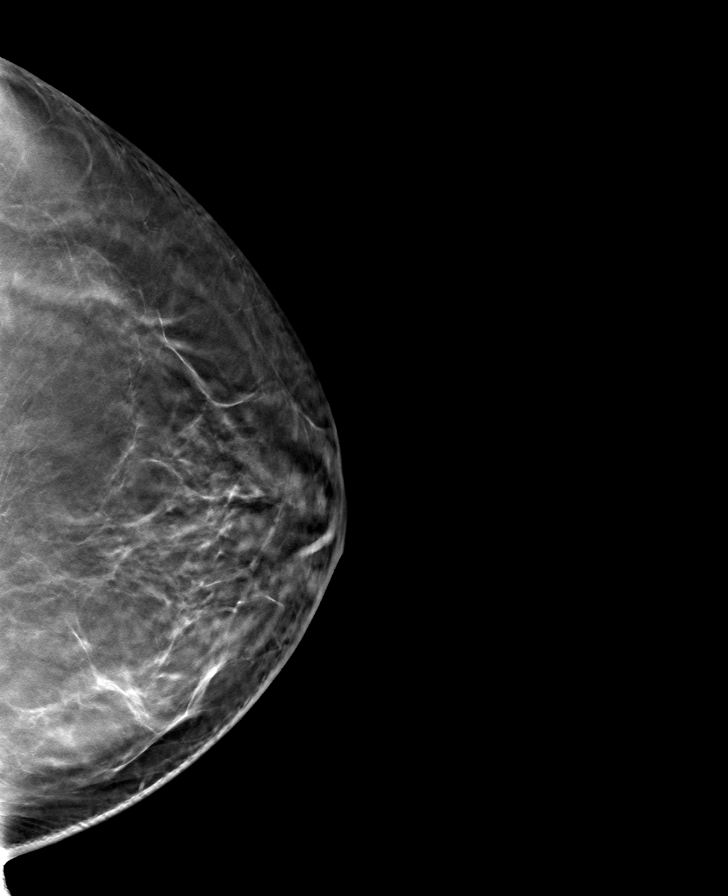

[L MLO tomo · tomo slice 53/105.0]
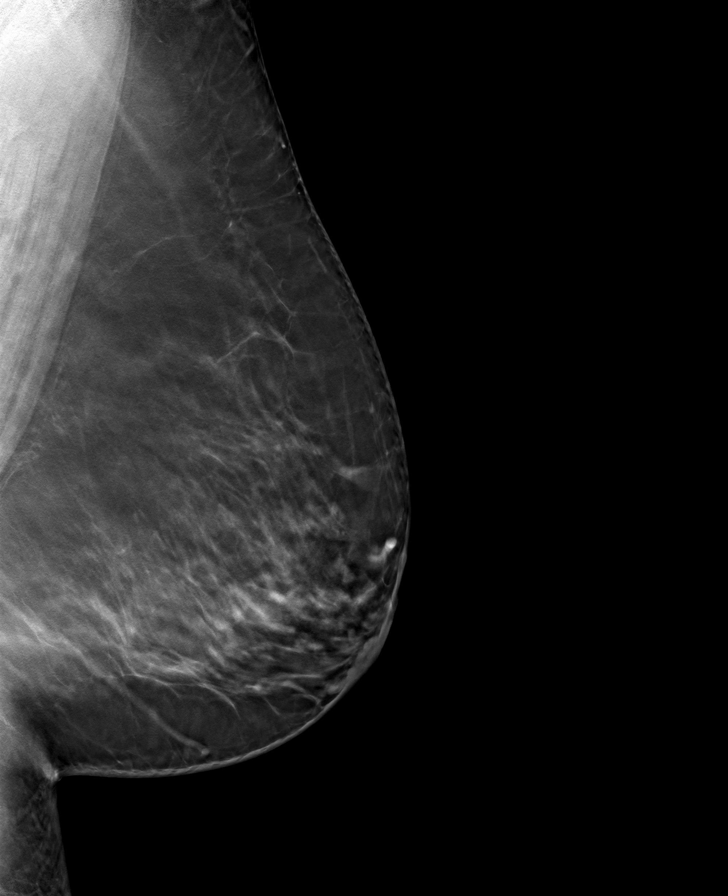

[8 of 24 positions shown; findings below may reference images not displayed]

ACR Breast Density Category b: There are scattered areas of
fibroglandular density.
FINDINGS: There are no findings suspicious for malignancy. Images were
processed with CAD.
IMPRESSION: No mammographic evidence of malignancy. A result letter of this
screening mammogram will be mailed directly to the patient.

RECOMMENDATION:
Screening mammogram in one year. (Code:[id number])

BI-RADS CATEGORY  1: Negative.

## 2021-12-11 ENCOUNTER — Other Ambulatory Visit: Payer: Self-pay

## 2021-12-11 DIAGNOSIS — Z1231 Encounter for screening mammogram for malignant neoplasm of breast: Secondary | ICD-10-CM

## 2021-12-14 ENCOUNTER — Ambulatory Visit
Admission: RE | Admit: 2021-12-14 | Discharge: 2021-12-14 | Disposition: A | Discharge status: Home / Self Care | Payer: Medicare Other | Source: Ambulatory Visit | Attending: Physician Assistant | Admitting: Physician Assistant

## 2021-12-14 DIAGNOSIS — Z1231 Encounter for screening mammogram for malignant neoplasm of breast: Secondary | ICD-10-CM | POA: Diagnosis present

## 2022-10-19 ENCOUNTER — Encounter: Payer: Self-pay | Admitting: *Deleted

## 2022-10-22 ENCOUNTER — Encounter: Payer: Self-pay | Admitting: *Deleted

## 2022-10-29 ENCOUNTER — Encounter: Payer: Self-pay | Admitting: Emergency Medicine

## 2022-10-30 ENCOUNTER — Ambulatory Visit: Payer: Medicare Other | Admitting: Anesthesiology

## 2022-10-30 ENCOUNTER — Ambulatory Visit
Admission: RE | Admit: 2022-10-30 | Discharge: 2022-10-30 | Disposition: A | Discharge status: Home / Self Care | Payer: Medicare Other | Attending: Gastroenterology | Admitting: Gastroenterology

## 2022-10-30 ENCOUNTER — Encounter: Payer: Self-pay | Admitting: *Deleted

## 2022-10-30 ENCOUNTER — Encounter
Admission: RE | Disposition: A | Discharge status: Home / Self Care | Payer: Self-pay | Source: Home / Self Care | Attending: Gastroenterology

## 2022-10-30 ENCOUNTER — Other Ambulatory Visit: Payer: Self-pay

## 2022-10-30 DIAGNOSIS — I10 Essential (primary) hypertension: Secondary | ICD-10-CM | POA: Diagnosis not present

## 2022-10-30 DIAGNOSIS — E119 Type 2 diabetes mellitus without complications: Secondary | ICD-10-CM | POA: Diagnosis not present

## 2022-10-30 DIAGNOSIS — Z8041 Family history of malignant neoplasm of ovary: Secondary | ICD-10-CM | POA: Diagnosis not present

## 2022-10-30 DIAGNOSIS — Z1211 Encounter for screening for malignant neoplasm of colon: Secondary | ICD-10-CM | POA: Diagnosis present

## 2022-10-30 DIAGNOSIS — Z6841 Body Mass Index (BMI) 40.0 and over, adult: Secondary | ICD-10-CM | POA: Diagnosis not present

## 2022-10-30 DIAGNOSIS — Z9049 Acquired absence of other specified parts of digestive tract: Secondary | ICD-10-CM | POA: Insufficient documentation

## 2022-10-30 DIAGNOSIS — K219 Gastro-esophageal reflux disease without esophagitis: Secondary | ICD-10-CM | POA: Insufficient documentation

## 2022-10-30 DIAGNOSIS — K5730 Diverticulosis of large intestine without perforation or abscess without bleeding: Secondary | ICD-10-CM | POA: Diagnosis not present

## 2022-10-30 DIAGNOSIS — Z85038 Personal history of other malignant neoplasm of large intestine: Secondary | ICD-10-CM | POA: Diagnosis not present

## 2022-10-30 DIAGNOSIS — E669 Obesity, unspecified: Secondary | ICD-10-CM | POA: Diagnosis not present

## 2022-10-30 DIAGNOSIS — K640 First degree hemorrhoids: Secondary | ICD-10-CM | POA: Insufficient documentation

## 2022-10-30 HISTORY — PX: COLONOSCOPY WITH PROPOFOL: SHX5780

## 2022-10-30 HISTORY — DX: Type 2 diabetes mellitus without complications: E11.9

## 2022-10-30 LAB — GLUCOSE, CAPILLARY: Glucose-Capillary: 126 mg/dL — ABNORMAL HIGH (ref 70–99)

## 2022-10-30 SURGERY — COLONOSCOPY WITH PROPOFOL
Anesthesia: General

## 2022-10-30 MED ORDER — SODIUM CHLORIDE 0.9 % IV SOLN: INTRAVENOUS | Status: DC

## 2022-10-30 MED ORDER — PROPOFOL 1000 MG/100ML IV EMUL
INTRAVENOUS | Status: AC
  Filled 2022-10-30: qty 300

## 2022-10-30 MED ORDER — PROPOFOL 10 MG/ML IV BOLUS
INTRAVENOUS | Status: DC | PRN
  Administered 2022-10-30: 80 mg via INTRAVENOUS

## 2022-10-30 MED ORDER — LIDOCAINE HCL (CARDIAC) PF 100 MG/5ML IV SOSY
PREFILLED_SYRINGE | INTRAVENOUS | Status: DC | PRN
  Administered 2022-10-30: 40 mg via INTRAVENOUS

## 2022-10-30 MED ORDER — PROPOFOL 10 MG/ML IV BOLUS
INTRAVENOUS | Status: AC
  Filled 2022-10-30: qty 60

## 2022-10-30 MED ORDER — PROPOFOL 500 MG/50ML IV EMUL
INTRAVENOUS | Status: DC | PRN
  Administered 2022-10-30: 150 ug/kg/min via INTRAVENOUS

## 2022-10-30 NOTE — Anesthesia Preprocedure Evaluation (Signed)
Anesthesia Evaluation  Patient identified by MRN, date of birth, ID band Patient awake    Reviewed: Allergy & Precautions, NPO status , Patient's Chart, lab work & pertinent test results  History of Anesthesia Complications Negative for: history of anesthetic complications  Airway Mallampati: III  TM Distance: >3 FB Neck ROM: Full    Dental  (+) Partial Upper   Pulmonary neg shortness of breath, neg sleep apnea, neg COPD, Patient abstained from smoking.Not current smoker, former smoker   Pulmonary exam normal breath sounds clear to auscultation       Cardiovascular Exercise Tolerance: Good METShypertension, Pt. on medications (-) CAD and (-) Past MI (-) dysrhythmias  Rhythm:Regular Rate:Normal - Systolic murmurs    Neuro/Psych negative neurological ROS  negative psych ROS   GI/Hepatic ,GERD  Medicated,,(+)     (-) substance abuse    Endo/Other  diabetes    Renal/GU negative Renal ROS     Musculoskeletal   Abdominal   Peds  Hematology   Anesthesia Other Findings Past Medical History: No date: Arthritis No date: Cancer (HCC)     Comment:  colon cancer No date: Diabetes mellitus without complication (HCC) No date: Diverticula of colon No date: GERD (gastroesophageal reflux disease) No date: Hyperlipidemia No date: Hypertension No date: Obesity No date: Sickle cell trait (HCC)  Reproductive/Obstetrics                             Anesthesia Physical Anesthesia Plan  ASA: 2  Anesthesia Plan: General   Post-op Pain Management: Minimal or no pain anticipated   Induction: Intravenous  PONV Risk Score and Plan: 3 and Propofol infusion, TIVA and Ondansetron  Airway Management Planned: Nasal Cannula  Additional Equipment: None  Intra-op Plan:   Post-operative Plan:   Informed Consent: I have reviewed the patients History and Physical, chart, labs and discussed the procedure  including the risks, benefits and alternatives for the proposed anesthesia with the patient or authorized representative who has indicated his/her understanding and acceptance.     Dental advisory given  Plan Discussed with: CRNA and Surgeon  Anesthesia Plan Comments: (Discussed risks of anesthesia with patient, including possibility of difficulty with spontaneous ventilation under anesthesia necessitating airway intervention, PONV, and rare risks such as cardiac or respiratory or neurological events, and allergic reactions. Discussed the role of CRNA in patient's perioperative care. Patient understands.)       Anesthesia Quick Evaluation

## 2022-10-30 NOTE — Interval H&P Note (Signed)
History and Physical Interval Note:  10/30/2022 7:48 AM  Terri Cain  has presented today for surgery, with the diagnosis of hx of cca.  The various methods of treatment have been discussed with the patient and family. After consideration of risks, benefits and other options for treatment, the patient has consented to  Procedure(s) with comments: COLONOSCOPY WITH PROPOFOL (N/A) - DM as a surgical intervention.  The patient's history has been reviewed, patient examined, no change in status, stable for surgery.  I have reviewed the patient's chart and labs.  Questions were answered to the patient's satisfaction.     Regis Bill  Ok to proceed with colonoscopy

## 2022-10-30 NOTE — Transfer of Care (Signed)
Immediate Anesthesia Transfer of Care Note  Patient: Terri Cain  Procedure(s) Performed: COLONOSCOPY WITH PROPOFOL  Patient Location: Endoscopy Unit  Anesthesia Type:General  Level of Consciousness: awake, drowsy, and patient cooperative  Airway & Oxygen Therapy: Patient Spontanous Breathing and Patient connected to nasal cannula oxygen  Post-op Assessment: Report given to RN, Post -op Vital signs reviewed and stable, and Patient moving all extremities X 4  Post vital signs: Reviewed and stable  Last Vitals:  Vitals Value Taken Time  BP 118/81 10/30/22 0809  Temp 35.7 C 10/30/22 0809  Pulse 64 10/30/22 0810  Resp 19 10/30/22 0810  SpO2 100 % 10/30/22 0810  Vitals shown include unfiled device data.  Last Pain:  Vitals:   10/30/22 0809  TempSrc: Temporal  PainSc: 0-No pain         Complications: No notable events documented.

## 2022-10-30 NOTE — Anesthesia Postprocedure Evaluation (Signed)
Anesthesia Post Note  Patient: Terri Cain  Procedure(s) Performed: COLONOSCOPY WITH PROPOFOL  Patient location during evaluation: Endoscopy Anesthesia Type: General Level of consciousness: awake and alert Pain management: pain level controlled Vital Signs Assessment: post-procedure vital signs reviewed and stable Respiratory status: spontaneous breathing, nonlabored ventilation, respiratory function stable and patient connected to nasal cannula oxygen Cardiovascular status: blood pressure returned to baseline and stable Postop Assessment: no apparent nausea or vomiting Anesthetic complications: no   No notable events documented.   Last Vitals:  Vitals:   10/30/22 0821 10/30/22 0829  BP: 116/81 123/79  Pulse: (!) 59 (!) 57  Resp: 20 (!) 26  Temp:    SpO2: 97% 95%    Last Pain:  Vitals:   10/30/22 0829  TempSrc:   PainSc: 0-No pain                 Corinda Gubler

## 2022-10-30 NOTE — H&P (Signed)
Outpatient short stay form Pre-procedure 10/30/2022  Regis Bill, MD  Primary Physician: Patrice Paradise, MD  Reason for visit:  Surveillance  History of present illness:    67 y/o lady with history of obesity, hypertension, and history of cancerous polyp in the 1980's s/p colon resection. No blood thinners. No family history of GI malignancies but mother had ovarian cancer.    Current Facility-Administered Medications:    0.9 %  sodium chloride infusion, , Intravenous, Continuous, Nafisah Runions, Rossie Muskrat, MD, Last Rate: 20 mL/hr at 10/30/22 0704, New Bag at 10/30/22 0704  Medications Prior to Admission  Medication Sig Dispense Refill Last Dose   Aspirin-Caffeine (ANACIN PO) Take 2 tablets by mouth as needed (for pain).   Past Week   diltiazem (CARDIZEM) 30 MG tablet Take 120 mg by mouth daily at 2 am.   10/30/2022 at 0530   esomeprazole (NEXIUM) 40 MG capsule Take by mouth.   10/29/2022   lisinopril-hydrochlorothiazide (PRINZIDE,ZESTORETIC) 20-25 MG tablet Take by mouth.   10/30/2022 at 0530   meloxicam (MOBIC) 7.5 MG tablet Take 7.5 mg by mouth daily.   Past Week   metFORMIN (GLUCOPHAGE) 500 MG tablet Take 500 mg by mouth 2 (two) times daily.   10/29/2022   metoprolol succinate (TOPROL-XL) 25 MG 24 hr tablet Take by mouth.   10/30/2022 at 0530   Multiple Vitamin (MULTI-VITAMINS) TABS Take by mouth.   Past Week   ranitidine (ZANTAC) 150 MG tablet Take by mouth.   10/29/2022   rosuvastatin (CRESTOR) 10 MG tablet Take 10 mg by mouth daily.   10/29/2022   Vitamin D, Ergocalciferol, (DRISDOL) 1.25 MG (50000 UNIT) CAPS capsule Take 50,000 Units by mouth every 7 (seven) days.   Past Week   Semaglutide (RYBELSUS) 3 MG TABS Take 3 mg by mouth daily. (Patient not taking: Reported on 10/30/2022)   Not Taking     Allergies  Allergen Reactions   Penicillins Hives     Past Medical History:  Diagnosis Date   Arthritis    Cancer (HCC)    colon cancer   Diabetes mellitus without  complication (HCC)    Diverticula of colon    GERD (gastroesophageal reflux disease)    Hyperlipidemia    Hypertension    Obesity    Sickle cell trait (HCC)     Review of systems:  Otherwise negative.    Physical Exam  Gen: Alert, oriented. Appears stated age.  HEENT: PERRLA. Lungs: No respiratory distress CV: RRR Abd: soft, benign, no masses Ext: No edema    Planned procedures: Proceed with colonoscopy. The patient understands the nature of the planned procedure, indications, risks, alternatives and potential complications including but not limited to bleeding, infection, perforation, damage to internal organs and possible oversedation/side effects from anesthesia. The patient agrees and gives consent to proceed.  Please refer to procedure notes for findings, recommendations and patient disposition/instructions.     Regis Bill, MD [facility]

## 2022-10-30 NOTE — Op Note (Signed)
[facility]astroenter[facility] Patient Name: Terri Cain Procedure Date: 10/30/2022 7:49 AM MRN: [id number] Account #: [id number] Date of Birth: Nov 12, 1955 Admit Type: Outpatient Age: 67 Room: [facility] ENDO ROOM 1 Gender: Female Note Status: Finalized Instrument Name: Prentice Docker [id number] Procedure:             Colonoscopy Indications:           High risk colon cancer surveillance: Personal history                         of colon cancer Providers:             Eather Colas MD, MD Referring MD:          Marilynne Halsted, MD (Referring MD) Medicines:             Monitored Anesthesia Care Complications:         No immediate complications. Procedure:             Pre-Anesthesia Assessment:                        - Prior to the procedure, a History and Physical was                         performed, and patient medications and allergies were                         reviewed. The patient is competent. The risks and                         benefits of the procedure and the sedation options and                         risks were discussed with the patient. All questions                         were answered and informed consent was obtained.                         Patient identification and proposed procedure were                         verified by the physician, the nurse, the                         anesthesiologist, the anesthetist and the technician                         in the endoscopy suite. Mental Status Examination:                         alert and oriented. Airway Examination: normal                         oropharyngeal airway and neck mobility. Respiratory                         Examination: clear to auscultation. CV Examination:  normal. Prophylactic Antibiotics: The patient does not                         require prophylactic antibiotics. Prior                         Anticoagulants: The patient has taken no anticoagulant                          or antiplatelet agents. ASA Grade Assessment: II - A                         patient with mild systemic disease. After reviewing                         the risks and benefits, the patient was deemed in                         satisfactory condition to undergo the procedure. The                         anesthesia plan was to use monitored anesthesia care                         (MAC). Immediately prior to administration of                         medications, the patient was re-assessed for adequacy                         to receive sedatives. The heart rate, respiratory                         rate, oxygen saturations, blood pressure, adequacy of                         pulmonary ventilation, and response to care were                         monitored throughout the procedure. The physical                         status of the patient was re-assessed after the                         procedure.                        After obtaining informed consent, the colonoscope was                         passed under direct vision. Throughout the procedure,                         the patient's blood pressure, pulse, and oxygen                         saturations were monitored continuously. The  Colonoscope was introduced through the anus and                         advanced to the the cecum, identified by appendiceal                         orifice and ileocecal valve. The colonoscopy was                         performed without difficulty. The patient tolerated                         the procedure well. The quality of the bowel                         preparation was good. The ileocecal valve, appendiceal                         orifice, and rectum were photographed. Findings:      The perianal and digital rectal examinations were normal.      Scattered small-mouthed diverticula were found in the sigmoid colon and       ascending colon.       Internal hemorrhoids were found during retroflexion. The hemorrhoids       were Grade I (internal hemorrhoids that do not prolapse).      The exam was otherwise without abnormality on direct and retroflexion       views. Impression:            - Diverticulosis in the sigmoid colon and in the                         ascending colon.                        - Internal hemorrhoids.                        - The examination was otherwise normal on direct and                         retroflexion views.                        - No specimens collected. Recommendation:        - Discharge patient to home.                        - Resume previous diet.                        - Continue present medications.                        - Repeat colonoscopy in 5 years for surveillance.                        - Return to referring physician as previously                         scheduled. Procedure Code(s):     --- Professional ---  G0105, Colorectal cancer screening; colonoscopy on                         individual at high risk Diagnosis Code(s):     --- Professional ---                        Z85.038, Personal history of other malignant neoplasm                         of large intestine                        K64.0, First degree hemorrhoids                        K57.30, Diverticulosis of large intestine without                         perforation or abscess without bleeding CPT copyright 2022 [employer]. All rights reserved. The codes documented in this report are preliminary and upon coder review may  be revised to meet current compliance requirements. Eather Colas MD, MD 10/30/2022 8:08:29 AM Number of Addenda: 0 Note Initiated On: 10/30/2022 7:49 AM Scope Withdrawal Time: 0 hours 4 minutes 57 seconds  Total Procedure Duration: 0 hours 9 minutes 14 seconds  Estimated Blood Loss:  Estimated blood loss: none.      [facility]

## 2022-10-31 ENCOUNTER — Encounter: Payer: Self-pay | Admitting: Gastroenterology

## 2022-11-20 ENCOUNTER — Other Ambulatory Visit: Payer: Self-pay | Admitting: Physician Assistant

## 2022-11-20 DIAGNOSIS — Z1231 Encounter for screening mammogram for malignant neoplasm of breast: Secondary | ICD-10-CM

## 2022-12-18 ENCOUNTER — Ambulatory Visit
Admission: RE | Admit: 2022-12-18 | Discharge: 2022-12-18 | Disposition: A | Discharge status: Home / Self Care | Payer: Medicare Other | Source: Ambulatory Visit | Attending: Physician Assistant | Admitting: Physician Assistant

## 2022-12-18 DIAGNOSIS — Z1231 Encounter for screening mammogram for malignant neoplasm of breast: Secondary | ICD-10-CM | POA: Insufficient documentation

## 2023-05-17 ENCOUNTER — Other Ambulatory Visit: Payer: Self-pay | Admitting: Internal Medicine

## 2023-05-17 DIAGNOSIS — R002 Palpitations: Secondary | ICD-10-CM

## 2023-05-17 DIAGNOSIS — R0789 Other chest pain: Secondary | ICD-10-CM

## 2023-05-28 ENCOUNTER — Ambulatory Visit
Admission: RE | Admit: 2023-05-28 | Discharge: 2023-05-28 | Disposition: A | Discharge status: Home / Self Care | Source: Ambulatory Visit | Attending: Internal Medicine | Admitting: Internal Medicine

## 2023-05-28 DIAGNOSIS — R0789 Other chest pain: Secondary | ICD-10-CM | POA: Insufficient documentation

## 2023-05-28 DIAGNOSIS — R002 Palpitations: Secondary | ICD-10-CM | POA: Insufficient documentation

## 2023-12-03 ENCOUNTER — Other Ambulatory Visit: Payer: Self-pay | Admitting: Physician Assistant

## 2023-12-03 DIAGNOSIS — Z1231 Encounter for screening mammogram for malignant neoplasm of breast: Secondary | ICD-10-CM

## 2024-01-06 ENCOUNTER — Ambulatory Visit
Admission: RE | Admit: 2024-01-06 | Discharge: 2024-01-06 | Disposition: A | Discharge status: Home / Self Care | Source: Ambulatory Visit | Attending: Physician Assistant | Admitting: Physician Assistant

## 2024-01-06 DIAGNOSIS — Z1231 Encounter for screening mammogram for malignant neoplasm of breast: Secondary | ICD-10-CM | POA: Diagnosis present
